# Patient Record
Sex: Male | Born: 1990 | Race: Black or African American | Hispanic: No | Marital: Single | Smoking: Never smoker
Health system: Southern US, Community
[De-identification: ages and names within clinical notes are randomized; demographics above are authoritative.]

---

## 2015-06-13 ENCOUNTER — Emergency Department (HOSPITAL_COMMUNITY): Payer: No Typology Code available for payment source

## 2015-06-13 ENCOUNTER — Encounter (HOSPITAL_COMMUNITY): Payer: Self-pay | Admitting: Emergency Medicine

## 2015-06-13 ENCOUNTER — Emergency Department (HOSPITAL_COMMUNITY)
Admission: EM | Admit: 2015-06-13 | Discharge: 2015-06-13 | Disposition: A | Discharge status: Home / Self Care | Payer: No Typology Code available for payment source | Attending: Emergency Medicine | Admitting: Emergency Medicine

## 2015-06-13 DIAGNOSIS — S4992XA Unspecified injury of left shoulder and upper arm, initial encounter: Secondary | ICD-10-CM | POA: Insufficient documentation

## 2015-06-13 DIAGNOSIS — Y92410 Unspecified street and highway as the place of occurrence of the external cause: Secondary | ICD-10-CM | POA: Diagnosis not present

## 2015-06-13 DIAGNOSIS — S199XXA Unspecified injury of neck, initial encounter: Secondary | ICD-10-CM | POA: Diagnosis present

## 2015-06-13 DIAGNOSIS — Y9389 Activity, other specified: Secondary | ICD-10-CM | POA: Diagnosis not present

## 2015-06-13 DIAGNOSIS — Y999 Unspecified external cause status: Secondary | ICD-10-CM | POA: Insufficient documentation

## 2015-06-13 DIAGNOSIS — S39012A Strain of muscle, fascia and tendon of lower back, initial encounter: Secondary | ICD-10-CM | POA: Insufficient documentation

## 2015-06-13 DIAGNOSIS — S161XXA Strain of muscle, fascia and tendon at neck level, initial encounter: Secondary | ICD-10-CM | POA: Diagnosis not present

## 2015-06-13 MED ORDER — HYDROCODONE-ACETAMINOPHEN 5-325 MG PO TABS: 1.0000 | ORAL_TABLET | Freq: Four times a day (QID) | ORAL | Status: AC | PRN

## 2015-06-13 MED ORDER — IBUPROFEN 800 MG PO TABS: 800.0000 mg | ORAL_TABLET | Freq: Three times a day (TID) | ORAL | Status: AC | PRN
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
## 2015-06-13 NOTE — Discharge Instructions (Signed)
Return here as needed.  Use ice and heat on your neck and back.  Your x-rays did not show any abnormality
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
